# Patient Record
Sex: Female | Born: 2005 | Race: White | Hispanic: No | Marital: Single | Smoking: Never smoker
Health system: Southern US, Community
[De-identification: ages and names within clinical notes are randomized; demographics above are authoritative.]

## PROBLEM LIST (undated history)

## (undated) DIAGNOSIS — F419 Anxiety disorder, unspecified: Secondary | ICD-10-CM

## (undated) DIAGNOSIS — J45909 Unspecified asthma, uncomplicated: Secondary | ICD-10-CM

## (undated) HISTORY — DX: Anxiety disorder, unspecified: F41.9

---
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
## 2024-02-17 ENCOUNTER — Ambulatory Visit: Admitting: Nurse Practitioner

## 2024-02-17 ENCOUNTER — Encounter: Payer: Self-pay | Admitting: Nurse Practitioner

## 2024-02-17 VITALS — BP 139/79 | HR 96 | Temp 98.5°F | Ht 64.0 in | Wt 262.6 lb

## 2024-02-17 DIAGNOSIS — H6503 Acute serous otitis media, bilateral: Secondary | ICD-10-CM

## 2024-02-17 DIAGNOSIS — H6690 Otitis media, unspecified, unspecified ear: Secondary | ICD-10-CM | POA: Insufficient documentation

## 2024-02-17 MED ORDER — AMOXICILLIN-POT CLAVULANATE 875-125 MG PO TABS: 1.0000 | ORAL_TABLET | Freq: Two times a day (BID) | ORAL | 0 refills | Status: AC
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
## 2024-02-17 NOTE — Assessment & Plan Note (Signed)
 Acute for 3 days. Will send in Augmentin BID for 7 days.  Educated her on this.  May take Tylenol or Ibuprofen as needed for pain.  Recommend no emersion in water until finished abx and feeling better. If worsening or ongoing return to office.

## 2024-02-17 NOTE — Patient Instructions (Signed)
Earache, Adult An earache, or ear pain, can be caused by many things, including: An infection. Ear wax buildup. Ear pressure. Something in the ear that should not be there (foreign body). A sore throat. Tooth problems. Jaw problems. Treatment of the earache will depend on the cause. If the cause is not clear or cannot be known, you may need to watch your symptoms until your earache goes away or until a cause is found. Follow these instructions at home: Medicines Take or apply over-the-counter and prescription medicines only as told by your health care provider. If you were prescribed antibiotics, use them as told by your health care provider. Do not stop using the antibiotic even if you start to feel better. Do not put anything in your ear other than medicine that is prescribed by your health care provider. Managing pain     If directed, apply heat to the affected area as often as told by your health care provider. Use the heat source that your health care provider recommends, such as a moist heat pack or a heating pad. Place a towel between your skin and the heat source. Leave the heat on for 20-30 minutes. If your skin turns bright red, remove the heat right away to prevent burns. The risk of burns is higher if you cannot feel pain, heat, or cold. If directed, put ice on the affected area. To do this: Put ice in a plastic bag. Place a towel between your skin and the bag. Leave the ice on for 20 minutes, 2-3 times a day. If your skin turns bright red, remove the ice right away to prevent skin damage. The risk of skin damage is higher if you cannot feel pain, heat, or cold.  General instructions Pay attention to any changes in your symptoms. Try resting in an upright position instead of lying down. This may help to reduce pressure in your ear and relieve pain. Chew gum if it helps to relieve your ear pain. Treat any allergies as told by your health care provider. Drink enough fluid  to keep your urine pale yellow. It is up to you to get the results of any tests that were done. Ask your health care provider, or the department that is doing the tests, when your results will be ready. Contact a health care provider if: Your pain does not improve within 2 days. Your earache gets worse. You have new symptoms. You have a fever. Get help right away if: You have a severe headache. You have a stiff neck. You have trouble swallowing. You have redness or swelling behind your ear. You have fluid or blood coming from your ear. You have hearing loss. You feel dizzy. This information is not intended to replace advice given to you by your health care provider. Make sure you discuss any questions you have with your health care provider. Document Revised: 12/16/2021 Document Reviewed: 12/16/2021 Elsevier Patient Education  2024 [employer]  

## 2024-05-22 ENCOUNTER — Other Ambulatory Visit: Payer: Self-pay

## 2024-05-22 ENCOUNTER — Emergency Department
Admission: EM | Admit: 2024-05-22 | Discharge: 2024-05-22 | Disposition: A | Discharge status: Home / Self Care | Payer: Self-pay | Attending: Emergency Medicine | Admitting: Emergency Medicine

## 2024-05-22 ENCOUNTER — Encounter: Payer: Self-pay | Admitting: Emergency Medicine

## 2024-05-22 DIAGNOSIS — J020 Streptococcal pharyngitis: Secondary | ICD-10-CM | POA: Insufficient documentation

## 2024-05-22 DIAGNOSIS — J45909 Unspecified asthma, uncomplicated: Secondary | ICD-10-CM | POA: Insufficient documentation

## 2024-05-22 LAB — RESP PANEL BY RT-PCR (RSV, FLU A&B, COVID)  RVPGX2
Influenza A by PCR: NEGATIVE
Influenza B by PCR: NEGATIVE
Resp Syncytial Virus by PCR: NEGATIVE
SARS Coronavirus 2 by RT PCR: NEGATIVE

## 2024-05-22 LAB — GROUP A STREP BY PCR: Group A Strep by PCR: DETECTED — AB

## 2024-05-22 MED ORDER — AMOXICILLIN 500 MG PO CAPS
500.0000 mg | ORAL_CAPSULE | Freq: Once | ORAL | Status: AC
  Administered 2024-05-22: 500 mg via ORAL
  Filled 2024-05-22: qty 1

## 2024-05-22 MED ORDER — AMOXICILLIN 500 MG PO CAPS: 500.0000 mg | ORAL_CAPSULE | Freq: Two times a day (BID) | ORAL | 0 refills | Status: AC

## 2024-05-22 NOTE — Discharge Instructions (Addendum)
 You were seen in the emergency department for strep pharyngitis.  I will call you with the results of your COVID, flu, and RSV swab if they are positive; otherwise they are negative and you are able to go back to work once 24 hours with antibiotics and fever free.  Please drink cool liquids and use ibuprofen and Tylenol at home to help with your pain.  Please pick up and take antibiotics as prescribed with food.  Change your toothbrush out in a couple of days.  Follow-up with your primary care provider as needed or in the emergency department for any new, worsening or concerning symptoms.

## 2024-05-22 NOTE — ED Triage Notes (Signed)
 Pt to ED from home c/o sore throat since yesterday.  Denies fever, cough.

## 2024-05-22 NOTE — ED Provider Notes (Signed)
 [facility] Provider Note    Event Date/Time   First MD Initiated Contact with Patient 05/22/24 2103     (approximate)   History   Sore Throat   HPI  Kathy Chase is a 18 y.o. female  with a past medical history of panic anxiety syndrome, ADD, asthma presents to the emergency department with sore throat and painful swallowing x 1 day. Patient denies fever, cough, rhinorrhea, chest pain, dyspnea, nausea, vomiting or abdominal pain. 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     Physical Exam   Triage Vital Signs: ED Triage Vitals  Encounter Vitals Group     BP 05/22/24 2042 (!) 133/90     Girls Systolic BP Percentile --      Girls Diastolic BP Percentile --      Boys Systolic BP Percentile --      Boys Diastolic BP Percentile --      Pulse Rate 05/22/24 2042 (!) 109     Resp 05/22/24 2042 18     Temp 05/22/24 2042 98.5 F (36.9 C)     Temp Source 05/22/24 2042 Oral     SpO2 05/22/24 2042 100 %     Weight 05/22/24 2041 262 lb 5.6 oz (119 kg)     Height 05/22/24 2041 5' 4 (1.626 m)     Head Circumference --      Peak Flow --      Pain Score 05/22/24 2040 6     Pain Loc --      Pain Education --      Exclude from Growth Chart --     Most recent vital signs: Vitals:   05/22/24 2042 05/22/24 2208  BP: (!) 133/90 110/80  Pulse: (!) 109 92  Resp: 18 16  Temp: 98.5 F (36.9 C) 98 F (36.7 C)  SpO2: 100% 98%    General: Awake, in no acute distress. Appears stated age. Head: Normocephalic, atraumatic. Eyes: No scleral icterus or conjunctival injection. Ears/Nose/Throat: TMs intact b/l. Nares patent, no nasal discharge. Oropharynx moist, no exudates, but there is bilateral erythema and enlarged tonsils. Dentition intact. Neck: Supple, no nuchal rigidity. Anterior cervical LAD is present. CV: Good peripheral perfusion.  Respiratory:Normal respiratory effort.  No respiratory distress.  CTAB. GI: Soft, non-distended. Skin:Warm, dry, intact. No rashes, lesions, or ecchymosis. No cyanosis or pallor.  ED Results / Procedures / Treatments   Labs (all labs ordered are listed, but only abnormal results are displayed) Labs Reviewed  GROUP A STREP BY PCR - Abnormal; Notable for the following components:      Result Value   Group A Strep by PCR DETECTED (*)    All other components within normal limits  RESP PANEL BY RT-PCR (RSV, FLU A&B, COVID)  [id number]     EKG     RADIOLOGY    PROCEDURES:  Critical Care performed: No   Procedures   MEDICATIONS ORDERED IN ED: Medications  amoxicillin  (AMOXIL ) capsule 500 mg (500 mg Oral Given 05/22/24 2207)     IMPRESSION / MDM / ASSESSMENT AND PLAN / ED COURSE  I reviewed the triage vital signs and the nursing notes.                              Differential diagnosis includes, but is not limited to, strep pharyngitis, tonsillitis, mono, viral pharyngitis  Patient's presentation  is most consistent with acute complicated illness / injury requiring diagnostic workup.  Patient is a 18 year old female with signs and symptoms as described above.  Patient is nontoxic-appearing has no hoarseness or hot potato voice, afebrile, no uvular deviation. All VS within normal range at discharge. Strep PCR is positive for strep.  Respiratory panel is negative, but patient did not want to wait for the results.  Told her I would call her if the respiratory panel produced any positive results.  Provided her with a dose of amoxicillin  here and will send the remainder to her pharmacy of choice.  She can follow-up with her primary care provider as needed.  The patient may return to the emergency department for any new, worsening, or concerning symptoms. Patient was given the opportunity to ask questions; all questions were answered. Emergency department return precautions were discussed with the patient.  Patient is in agreement to the treatment  plan.  Patient is stable for discharge.    FINAL CLINICAL IMPRESSION(S) / ED DIAGNOSES   Final diagnoses:  Strep throat     Rx / DC Orders   ED Discharge Orders          Ordered    amoxicillin  (AMOXIL ) 500 MG capsule  2 times daily        05/22/24 2151             Note:  This document was prepared using Dragon voice recognition software and may include unintentional dictation errors.     Sheron Salm, PA-C 05/22/24 2311    Viviann Pastor, MD 05/22/24 [phone]

## 2024-12-16 ENCOUNTER — Encounter: Admitting: Nurse Practitioner
